# Patient Record
Sex: Female | Born: 1995 | Race: White | Hispanic: No | Marital: Single | Smoking: Never smoker
Health system: Southern US, Community
[De-identification: ages and names within clinical notes are randomized; demographics above are authoritative.]

---
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
## 2017-12-16 NOTE — Progress Notes (Signed)
[facility] [address] [place], [place] [address] Phone: [phone] Subjective:      CC: Back pain  [id number]  Brittney Potts is a 22 y.o. female coming in with complaint of low back pain. Also has left sided groin pain. States that her hip pops. Stated that she gets leg pain as well. Has a MRI of her groin from a year ago. Numbness in quad.  Onset- 3 years  Location- lower back, groin Duration- worse at night  Character- Sharp Aggravating factors- Swimming, field hockey Reliving factors- Heat Therapies tried-  Severity-6 out of 10  Patient did have imaging in 2017 that was a bone scan.     No past medical history on file. No past surgical history on file. Social History   Socioeconomic History  . Marital status: Single    Spouse name: Not on file  . Number of children: Not on file  . Years of education: Not on file  . Highest education level: Not on file  Occupational History  . Not on file  Social Needs  . Financial resource strain: Not on file  . Food insecurity:    Worry: Not on file    Inability: Not on file  . Transportation needs:    Medical: Not on file    Non-medical: Not on file  Tobacco Use  . Smoking status: Never Smoker  . Smokeless tobacco: Never Used  Substance and Sexual Activity  . Alcohol use: Not on file  . Drug use: Not on file  . Sexual activity: Not on file  Lifestyle  . Physical activity:    Days per week: Not on file    Minutes per session: Not on file  . Stress: Not on file  Relationships  . Social connections:    Talks on phone: Not on file    Gets together: Not on file    Attends religious service: Not on file    Active member of club or organization: Not on file    Attends meetings of clubs or organizations: Not on file    Relationship status: Not on file  Other Topics Concern  . Not on file  Social History Narrative  . Not on file   No Known Allergies No family history on  file.  No family history of autoimmune   Past medical history, social, surgical and family history all reviewed in electronic medical record.  No pertanent information unless stated regarding to the chief complaint.   Review of Systems:Review of systems updated and as accurate as of 12/18/17  No headache, visual changes, nausea, vomiting, diarrhea, constipation, dizziness, abdominal pain, skin rash, fevers, chills, night sweats, weight loss, swollen lymph nodes, body aches, joint swelling, , chest pain, shortness of breath, mood changes.  Positive muscle aches  Objective  Blood pressure 100/80, pulse 97, height 5\' 5"  (1.651 m), weight 131 lb (59.4 kg), last menstrual period 12/04/2017, SpO2 98 %. Systems examined below as of 12/18/17   General: No apparent distress alert and oriented x3 mood and affect normal, dressed appropriately.  HEENT: Pupils equal, extraocular movements intact  Respiratory: Patient's speak in full sentences and does not appear short of breath  Cardiovascular: No lower extremity edema, non tender, no erythema  Skin: Warm dry intact with no signs of infection or rash on extremities or on axial skeleton.  Abdomen: Soft nontender  Neuro: Cranial nerves II through XII are intact, neurovascularly intact in all extremities with 2+ DTRs  and 2+ pulses.  Lymph: No lymphadenopathy of posterior or anterior cervical chain or axillae bilaterally.  Gait normal with good balance and coordination.  MSK:  Non tender with full range of motion and good stability and symmetric strength and tone of shoulders, elbows, wrist, hip, knee and ankles bilaterally.   Back Exam:  Inspection: Significant loss of lordosis Motion: Flexion 35 deg, Extension 25 deg, Side Bending to 20 deg bilaterally,  Rotation to 45 deg bilaterally  SLR laying: Negative  XSLR laying: Negative  Palpable tenderness: Tender to palpation the paraspinal musculature lumbar spine right greater than left. FABER: Tightness  bilaterally. Sensory change: Gross sensation intact to all lumbar and sacral dermatomes.  Reflexes: 2+ at both patellar tendons, 2+ at achilles tendons, Babinski's downgoing.  Strength at foot  Plantar-flexion: 5/5 Dorsi-flexion: 5/5 Eversion: 5/5 Inversion: 5/5  Leg strength  Quad: 5/5 Hamstring: 5/5 Hip flexor: 5/5 Hip abductors: 4/5 but symmetric Gait unremarkable.  Osteopathic findings C2 flexed rotated and side bent right C4 flexed rotated and side bent left T3 extended rotated and side bent right inhaled third rib T5 extended rotated and side bent left L3 flexed rotated and side bent right Sacrum right on right    Impression and Recommendations:     This case required medical decision making of moderate complexity.      Note: This dictation was prepared with Dragon dictation along with smaller phrase technology. Any transcriptional errors that result from this process are unintentional.

## 2017-12-18 ENCOUNTER — Encounter

## 2017-12-18 ENCOUNTER — Ambulatory Visit (INDEPENDENT_AMBULATORY_CARE_PROVIDER_SITE_OTHER)
Admission: RE | Admit: 2017-12-18 | Discharge: 2017-12-18 | Disposition: A | Discharge status: Home / Self Care | Payer: 59 | Source: Ambulatory Visit | Attending: Family Medicine | Admitting: Family Medicine

## 2017-12-18 ENCOUNTER — Ambulatory Visit (INDEPENDENT_AMBULATORY_CARE_PROVIDER_SITE_OTHER): Payer: 59 | Admitting: Family Medicine

## 2017-12-18 ENCOUNTER — Encounter: Payer: Self-pay | Admitting: Family Medicine

## 2017-12-18 VITALS — BP 100/80 | HR 97 | Ht 65.0 in | Wt 131.0 lb

## 2017-12-18 DIAGNOSIS — M999 Biomechanical lesion, unspecified: Secondary | ICD-10-CM

## 2017-12-18 DIAGNOSIS — M545 Low back pain, unspecified: Secondary | ICD-10-CM | POA: Insufficient documentation

## 2017-12-18 DIAGNOSIS — M25552 Pain in left hip: Secondary | ICD-10-CM

## 2017-12-18 DIAGNOSIS — G8929 Other chronic pain: Secondary | ICD-10-CM | POA: Diagnosis not present

## 2017-12-18 NOTE — Assessment & Plan Note (Signed)
Decision today to treat with OMT was based on Physical Exam  After verbal consent patient was treated with HVLA, ME, FPR techniques in , thoracic, lumbar and sacral areas  Patient tolerated the procedure well with improvement in symptoms  Patient given exercises, stretches and lifestyle modifications  See medications in patient instructions if given  Patient will follow up in 4 weeks 

## 2017-12-18 NOTE — Patient Instructions (Signed)
Good to see you  Ice is your friend  Iron 65mg  daily with 500mg  with vitamin C Calcium pyruvate 1500mg  daily  Vitamin D 2000 IU daily  Exercises 3 times a week.  On wall with heels, butt shoulder and head touching for a goal of 5 minutes daily  Stay active Xrays downstairs See me again in 4 weeks

## 2017-12-18 NOTE — Assessment & Plan Note (Signed)
Low back pain.  Discussed icing regimen.  We discussed that I do think that this is mostly muscular in nature.  X-rays ordered today.  Has had some popping of the left hip.  We will see how patient responds to conservative therapy.  Attempted osteopathic manipulation.  Follow-up again in 4 weeks

## 2017-12-29 IMAGING — NM NM BONE 3 PHASE
10 series · 20 of 20 positions shown · non-contrast
Comparison: Upper GI 07/06/2004

CLINICAL DATA: Bilateral hip pain.  Active in sports

EXAM:
NUCLEAR MEDICINE 3-PHASE BONE SCAN
TECHNIQUE: Radionuclide angiographic images, immediate static blood pool
images, and 3-hour delayed static images were obtained of the pelvis
after intravenous injection of radiopharmaceutical.
RADIOPHARMACEUTICALS:  Twenty-seven mCi Uc-00m MDP

[Series 1: flow · 2.07mm/px · 6 of 48 frames shown (1 of 2)]
[frame 5/48  full-range]
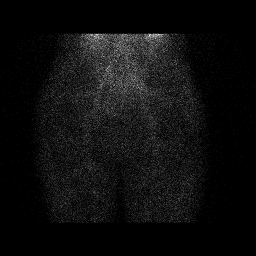
[frame 13/48  full-range]
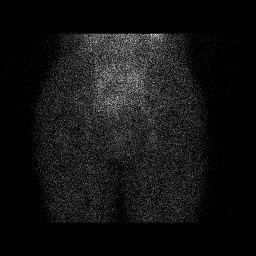
[frame 21/48  full-range]
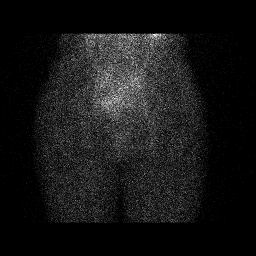
[frame 29/48  full-range]
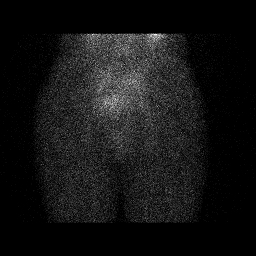
[frame 37/48  full-range]
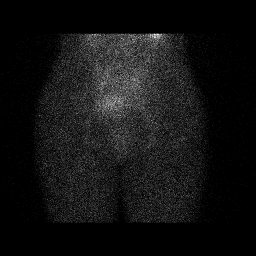
[frame 45/48  full-range]
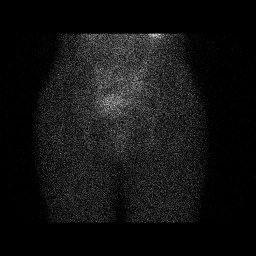

[Series 1: flow · 2.07mm/px · 6 of 48 frames shown (2 of 2)]
[frame 5/48  full-range]
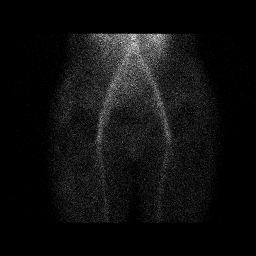
[frame 13/48  full-range]
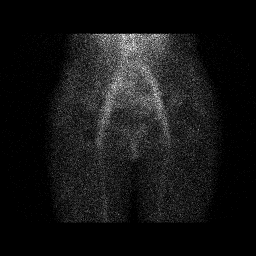
[frame 21/48  full-range]
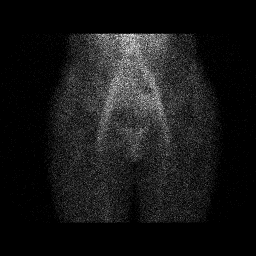
[frame 29/48  full-range]
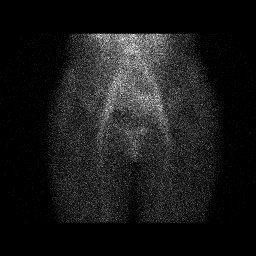
[frame 37/48  full-range]
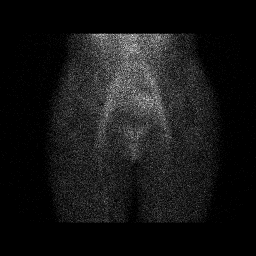
[frame 45/48  full-range]
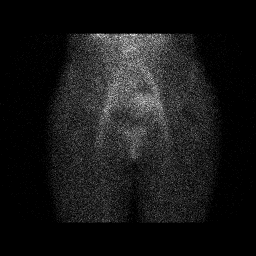

[Series 2: blood pool · 2.07mm/px · 1 of 1 slices shown (1 of 6)]
[im 1/1]
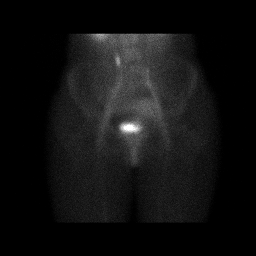

[Series 2: blood pool · 2.07mm/px · 1 of 1 slices shown (2 of 6)]
[im 1/1]
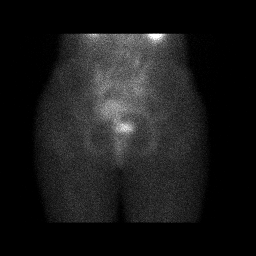

[Series 3: lat bp · 2.07mm/px · 1 of 1 slices shown (1 of 2)]
[im 1/1]
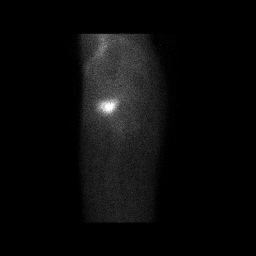

[Series 3: lat bp · 2.07mm/px · 1 of 1 slices shown (2 of 2)]
[im 1/1]
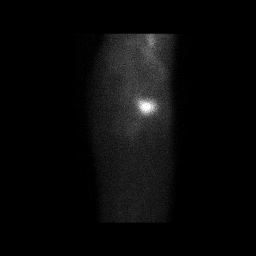

[Series 4: blood pool · 2.07mm/px · 1 of 1 slices shown (3 of 6)]
[im 1/1]
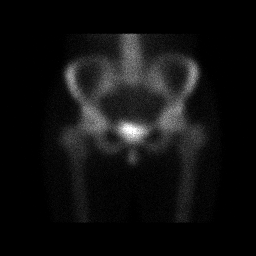

[Series 4: blood pool · 2.07mm/px · 1 of 1 slices shown (4 of 6)]
[im 1/1]
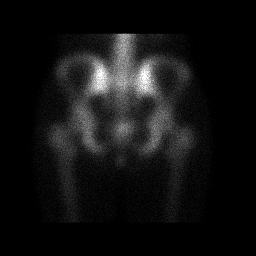

[Series 5: blood pool · 2.07mm/px · 1 of 1 slices shown (5 of 6)]
[im 1/1]
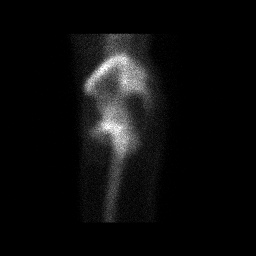

[Series 5: blood pool · 2.07mm/px · 1 of 1 slices shown (6 of 6)]
[im 1/1]
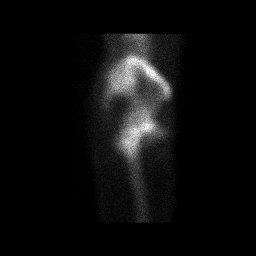

[20 of 20 positions shown; findings below may reference images not displayed]

FINDINGS: Vascular phase: No asymmetric or increased blood flow to the LEFT or
RIGHT hip.

Blood pool phase: No abnormal blood pool activity LEFT or RIGHT hip.

Delayed phase: Uniform uptake within the pelvis and proximal femurs.
IMPRESSION: Normal three-phase bone scan of the pelvis and hips.

## 2018-01-18 ENCOUNTER — Ambulatory Visit (INDEPENDENT_AMBULATORY_CARE_PROVIDER_SITE_OTHER): Payer: 59 | Admitting: Family Medicine

## 2018-01-18 ENCOUNTER — Encounter: Payer: Self-pay | Admitting: Family Medicine

## 2018-01-18 VITALS — BP 126/70 | HR 91 | Ht 65.0 in | Wt 129.0 lb

## 2018-01-18 DIAGNOSIS — M9904 Segmental and somatic dysfunction of sacral region: Secondary | ICD-10-CM | POA: Diagnosis not present

## 2018-01-18 DIAGNOSIS — M25652 Stiffness of left hip, not elsewhere classified: Secondary | ICD-10-CM

## 2018-01-18 DIAGNOSIS — M9902 Segmental and somatic dysfunction of thoracic region: Secondary | ICD-10-CM

## 2018-01-18 DIAGNOSIS — M545 Low back pain, unspecified: Secondary | ICD-10-CM

## 2018-01-18 DIAGNOSIS — G8929 Other chronic pain: Secondary | ICD-10-CM

## 2018-01-18 DIAGNOSIS — M9903 Segmental and somatic dysfunction of lumbar region: Secondary | ICD-10-CM

## 2018-01-18 DIAGNOSIS — M999 Biomechanical lesion, unspecified: Secondary | ICD-10-CM

## 2018-01-18 NOTE — Patient Instructions (Signed)
Good to see you  Lets keep trucking along Posture is key and work on the hip flexor See me again in 5-6 weeks and call us at [phone]

## 2018-01-18 NOTE — Progress Notes (Signed)
[facility] [address] [place], [place] [address] Phone: [phone] Subjective:     CC: Hip pain  [id number]  Brittney Potts is a 22 y.o. female coming in with complaint of hip pain. Yesterday she felt achy. Still some pain today.   Patient did respond well to manipulation.  Seem to be more of left hip flexor tightness.  Has been doing the exercises intermittently but not continuously.  Has been swimming competitively and doing well though.  History reviewed. No pertinent past medical history. History reviewed. No pertinent surgical history. Social History   Socioeconomic History  . Marital status: Single    Spouse name: Not on file  . Number of children: Not on file  . Years of education: Not on file  . Highest education level: Not on file  Occupational History  . Not on file  Social Needs  . Financial resource strain: Not on file  . Food insecurity:    Worry: Not on file    Inability: Not on file  . Transportation needs:    Medical: Not on file    Non-medical: Not on file  Tobacco Use  . Smoking status: Never Smoker  . Smokeless tobacco: Never Used  Substance and Sexual Activity  . Alcohol use: Not on file  . Drug use: Not on file  . Sexual activity: Not on file  Lifestyle  . Physical activity:    Days per week: Not on file    Minutes per session: Not on file  . Stress: Not on file  Relationships  . Social connections:    Talks on phone: Not on file    Gets together: Not on file    Attends religious service: Not on file    Active member of club or organization: Not on file    Attends meetings of clubs or organizations: Not on file    Relationship status: Not on file  Other Topics Concern  . Not on file  Social History Narrative  . Not on file   No Known Allergies History reviewed. No pertinent family history.   Past medical history, social, surgical and family history all reviewed in electronic medical record.  No  pertanent information unless stated regarding to the chief complaint.   Review of Systems:Review of systems updated and as accurate as of 01/18/18  No headache, visual changes, nausea, vomiting, diarrhea, constipation, dizziness, abdominal pain, skin rash, fevers, chills, night sweats, weight loss, swollen lymph nodes, body aches, joint swelling, muscle aches, chest pain, shortness of breath, mood changes.   Objective  Blood pressure 126/70, pulse 91, height 5\' 5"  (1.651 m), weight 129 lb (58.5 kg), SpO2 97 %. Systems examined below as of 01/18/18   General: No apparent distress alert and oriented x3 mood and affect normal, dressed appropriately.  HEENT: Pupils equal, extraocular movements intact  Respiratory: Patient's speak in full sentences and does not appear short of breath  Cardiovascular: No lower extremity edema, non tender, no erythema  Skin: Warm dry intact with no signs of infection or rash on extremities or on axial skeleton.  Abdomen: Soft nontender  Neuro: Cranial nerves II through XII are intact, neurovascularly intact in all extremities with 2+ DTRs and 2+ pulses.  Lymph: No lymphadenopathy of posterior or anterior cervical chain or axillae bilaterally.  Gait normal with good balance and coordination.  MSK:  Non tender with full range of motion and good stability and symmetric strength and tone of shoulders, elbows,  wrist, , knee and ankles bilaterally.  Hip: Right ROM IR: 45 Deg, ER: 45 Deg, Flexion: 120 Deg, Extension: 100 Deg, Abduction: 45 Deg, Adduction: 45 Deg Strength IR: 5/5, ER: 5/5, Flexion: 5/5, Extension: 5/5, Abduction: 5/5, Adduction: 5/5 Pelvic alignment unremarkable to inspection and palpation. Standing hip rotation and gait without trendelenburg sign / unsteadiness. Greater trochanter without tenderness to palpation. No tenderness over piriformis and greater trochanter. Pain over the right sacroiliac joint mild pain with Pearlean BrownieFaber significant tightness of the  hip flexor still noted  Osteopathic findings T6 extended rotated and side bent left L2 flexed rotated and side bent right Sacrum right on right     Impression and Recommendations:     This case required medical decision making of moderate complexity.      Note: This dictation was prepared with Dragon dictation along with smaller phrase technology. Any transcriptional errors that result from this process are unintentional.

## 2018-01-18 NOTE — Assessment & Plan Note (Signed)
   Decision today to treat with OMT was based on Physical Exam  After verbal consent patient was treated with HVLA, ME, FPR techniques in  thoracic, lumbar and sacral areas  Patient tolerated the procedure well with improvement in symptoms  Patient given exercises, stretches and lifestyle modifications  See medications in patient instructions if given  Patient will follow up in6 weeks 

## 2018-01-18 NOTE — Assessment & Plan Note (Signed)
Discussed posture and ergonomics.  Tightness of the hip flexors.  Discussed icing regimen and home exercises.  Discussed hip abductor strengthening.  Follow-up again in 4 to 6 weeks

## 2019-04-23 ENCOUNTER — Other Ambulatory Visit: Payer: Self-pay

## 2019-04-23 DIAGNOSIS — Z20822 Contact with and (suspected) exposure to covid-19: Secondary | ICD-10-CM

## 2019-04-24 LAB — NOVEL CORONAVIRUS, NAA: SARS-CoV-2, NAA: NOT DETECTED
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
